# Patient Record
Sex: Male | Born: 1995 | Race: Black or African American | Hispanic: No | Marital: Single | Smoking: Never smoker
Health system: Southern US, Community
[De-identification: ages and names within clinical notes are randomized; demographics above are authoritative.]

---
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
## 2014-09-29 ENCOUNTER — Ambulatory Visit: Payer: Self-pay | Admitting: Family Medicine

## 2015-02-02 ENCOUNTER — Ambulatory Visit
Admission: EM | Admit: 2015-02-02 | Discharge: 2015-02-02 | Disposition: A | Discharge status: Home / Self Care | Payer: Worker's Compensation

## 2016-07-01 ENCOUNTER — Emergency Department
Admission: EM | Admit: 2016-07-01 | Discharge: 2016-07-01 | Disposition: A | Discharge status: Home / Self Care | Payer: 59 | Attending: Emergency Medicine | Admitting: Emergency Medicine

## 2016-07-01 DIAGNOSIS — R1010 Upper abdominal pain, unspecified: Secondary | ICD-10-CM | POA: Diagnosis present

## 2016-07-01 DIAGNOSIS — K2900 Acute gastritis without bleeding: Secondary | ICD-10-CM | POA: Diagnosis not present

## 2016-07-01 LAB — COMPREHENSIVE METABOLIC PANEL
ALBUMIN: 4.8 g/dL (ref 3.5–5.0)
ALK PHOS: 92 U/L (ref 38–126)
ALT: 23 U/L (ref 17–63)
ANION GAP: 7 (ref 5–15)
AST: 36 U/L (ref 15–41)
BILIRUBIN TOTAL: 0.6 mg/dL (ref 0.3–1.2)
BUN: 14 mg/dL (ref 6–20)
CALCIUM: 9.3 mg/dL (ref 8.9–10.3)
CO2: 30 mmol/L (ref 22–32)
Chloride: 102 mmol/L (ref 101–111)
Creatinine, Ser: 0.85 mg/dL (ref 0.61–1.24)
Glucose, Bld: 94 mg/dL (ref 65–99)
POTASSIUM: 3.8 mmol/L (ref 3.5–5.1)
Sodium: 139 mmol/L (ref 135–145)
TOTAL PROTEIN: 7.9 g/dL (ref 6.5–8.1)

## 2016-07-01 LAB — CBC
HEMATOCRIT: 47.5 % (ref 40.0–52.0)
HEMOGLOBIN: 15.6 g/dL (ref 13.0–18.0)
MCH: 27.9 pg (ref 26.0–34.0)
MCHC: 32.8 g/dL (ref 32.0–36.0)
MCV: 85 fL (ref 80.0–100.0)
Platelets: 239 10*3/uL (ref 150–440)
RBC: 5.59 MIL/uL (ref 4.40–5.90)
RDW: 13.7 % (ref 11.5–14.5)
WBC: 5.5 10*3/uL (ref 3.8–10.6)

## 2016-07-01 LAB — LIPASE, BLOOD: Lipase: 24 U/L (ref 11–51)

## 2016-07-01 MED ORDER — PANTOPRAZOLE SODIUM 40 MG PO TBEC: 40.0000 mg | DELAYED_RELEASE_TABLET | Freq: Every day | ORAL | 1 refills | Status: AC

## 2016-07-01 MED ORDER — GI COCKTAIL ~~LOC~~
ORAL | Status: AC
  Filled 2016-07-01: qty 30

## 2016-07-01 MED ORDER — SUCRALFATE 1 G PO TABS: 1.0000 g | ORAL_TABLET | Freq: Four times a day (QID) | ORAL | 0 refills | Status: AC

## 2016-07-01 MED ORDER — GI COCKTAIL ~~LOC~~
30.0000 mL | Freq: Once | ORAL | Status: AC
  Administered 2016-07-01: 30 mL via ORAL

## 2016-07-01 NOTE — ED Triage Notes (Signed)
Pt reports to ED w/ c/o upper abd pain x 1 month. Pt sts it's a burning pain, worse after eating.  Pt denies n/v/d. Pt A/OX4, resp even and unlabored, NAD.

## 2016-07-01 NOTE — ED Provider Notes (Signed)
[facility] Emergency Department Provider Note   ____________________________________________    I have reviewed the triage vital signs and the nursing notes.   HISTORY  Chief Complaint Abdominal Pain     HPI Logan Cannon is a 21 y.o. male who presents with complaints of abdominal pain. Patient complains of several months of upper abdominal pain after eating. Seems to resolve overnight but then start again when he eats breakfast. He complains of burning gnawing sensation in his upper abdomen. He has tried Prilosec and Tums without relief. No vomiting. No black stools.   History reviewed. No pertinent past medical history.  There are no active problems to display for this patient.   History reviewed. No pertinent surgical history.  Prior to Admission medications   Medication Sig Start Date End Date Taking? Authorizing Provider  pantoprazole (PROTONIX) 40 MG tablet Take 1 tablet (40 mg total) by mouth daily. 07/01/16 07/01/17  Jene Everyobert Regan Mcbryar, MD  sucralfate (CARAFATE) 1 g tablet Take 1 tablet (1 g total) by mouth 4 (four) times daily. 07/01/16 07/01/17  Jene Everyobert Zainab Crumrine, MD     Allergies Patient has no known allergies.  No family history on file.  Social History Social History  Substance Use Topics  . Smoking status: Never Smoker  . Smokeless tobacco: Never Used  . Alcohol use Not on file    Review of Systems  Constitutional: No fever/chills Eyes: No visual changes.   Cardiovascular: Denies chest pain. Respiratory: Denies shortness of breath. Gastrointestinal:As above   Musculoskeletal: Negative for back pain. Skin: Negative for rash. Neurological: Negative for headaches   10-point ROS otherwise negative.  ____________________________________________   PHYSICAL EXAM:  VITAL SIGNS: ED Triage Vitals  Enc Vitals Group     BP 07/01/16 1908 138/79     Pulse Rate 07/01/16 1908 76     Resp 07/01/16 1908 16     Temp 07/01/16 1908  98.8 F (37.1 C)     Temp Source 07/01/16 1908 Oral     SpO2 07/01/16 1908 100 %     Weight 07/01/16 1906 174 lb (78.9 kg)     Height 07/01/16 1906 6' (1.829 m)     Head Circumference --      Peak Flow --      Pain Score 07/01/16 1909 7     Pain Loc --      Pain Edu? --      Excl. in GC? --     Constitutional: Alert and oriented. No acute distress. Pleasant and interactive Eyes: Conjunctivae are normal.   Nose: No congestion/rhinnorhea. Mouth/Throat: Mucous membranes are moist.    Cardiovascular: Normal rate, regular rhythm. Good peripheral circulation. Respiratory: Normal respiratory effort.  No retractions.  Gastrointestinal: Mild epigastric tenderness palpation.. No distention.  No CVA tenderness. Genitourinary: deferred Musculoskeletal:  Warm and well perfused Neurologic:  Normal speech and language. No gross focal neurologic deficits are appreciated.  Skin:  Skin is warm, dry and intact. No rash noted. Psychiatric: Mood and affect are normal. Speech and behavior are normal.  ____________________________________________   LABS (all labs ordered are listed, but only abnormal results are displayed)  Labs Reviewed  LIPASE, BLOOD  COMPREHENSIVE METABOLIC PANEL  CBC   ____________________________________________  EKG  None ____________________________________________  RADIOLOGY  None ____________________________________________   PROCEDURES  Procedure(s) performed: No    Critical Care performed: No ____________________________________________   INITIAL IMPRESSION / ASSESSMENT AND PLAN / ED COURSE  Pertinent labs & imaging results that were  available during my care of the patient were reviewed by me and considered in my medical decision making (see chart for details).  Patient presents with epigastric abdominal pain which he has had for some time. It does appear to be related to eating. Strongly suspect gastritis versus PUD. We will give a GI cocktail  and reevaluate  Patient had resolution of pain with GI cocktail. I'll start him on a PPI and Carafate and have him follow-up with gastroenterology for likely endoscopy. patient agrees with the plan    ____________________________________________   FINAL CLINICAL IMPRESSION(S) / ED DIAGNOSES  Final diagnoses:  Acute gastritis without hemorrhage, unspecified gastritis type      NEW MEDICATIONS STARTED DURING THIS VISIT:  Discharge Medication List as of 07/01/2016  8:41 PM    START taking these medications   Details  pantoprazole (PROTONIX) 40 MG tablet Take 1 tablet (40 mg total) by mouth daily., Starting Fri 07/01/2016, Until Sat 07/01/2017, Print    sucralfate (CARAFATE) 1 g tablet Take 1 tablet (1 g total) by mouth 4 (four) times daily., Starting Fri 07/01/2016, Until Sat 07/01/2017, Print         Note:  This document was prepared using Dragon voice recognition software and may include unintentional dictation errors.    Jene Every, MD 07/01/16 2055

## 2016-07-28 ENCOUNTER — Ambulatory Visit (INDEPENDENT_AMBULATORY_CARE_PROVIDER_SITE_OTHER): Payer: 59 | Admitting: Gastroenterology

## 2016-07-28 ENCOUNTER — Encounter: Payer: Self-pay | Admitting: Gastroenterology

## 2016-07-28 VITALS — BP 152/82 | HR 79 | Temp 98.4°F | Resp 16 | Ht 72.0 in | Wt 167.2 lb

## 2016-07-28 DIAGNOSIS — R1013 Epigastric pain: Secondary | ICD-10-CM

## 2016-07-28 NOTE — Progress Notes (Signed)
   Gastroenterology Consultation  Referring Provider:     Dr Cyril Loosen  Primary Care Physician:  No PCP Per Patient Primary Gastroenterologist:  Dr. Wyline Mood  Reason for Consultation:     Abdominal pain         HPI:   Logan Cannon is a 21 y.o. y/o male referred by the ER.  He was recently seen at the ER for abdominal pain. He was given a GI cocktail and asked to follow up with GI. Ct scan back in 2009 suggested fecal impaction .Labs 06/2016- CBC, CMP-normal.   When he went into the ER had abdominal pain for a few weeks. Points to the upper abdomen , occurring every day, all day , at night as well . Localized. Eating made it worse. Bowel movements help. He has a bowel movement , twice a day soft . Denies any nsaid's use.  Was taking protonix and carafate which relieved all his pains and is pain free today . No smoking , alcohol or marijuana. No sick contacts, no heartburn, no weight loss. Feels well otherwise    No past medical history on file.  No past surgical history on file.  Prior to Admission medications   Medication Sig Start Date End Date Taking? Authorizing Provider  pantoprazole (PROTONIX) 40 MG tablet Take 1 tablet (40 mg total) by mouth daily. 07/01/16 07/01/17  Jene Every, MD  sucralfate (CARAFATE) 1 g tablet Take 1 tablet (1 g total) by mouth 4 (four) times daily. 07/01/16 07/01/17  Jene Every, MD    No family history on file.   Social History  Substance Use Topics  . Smoking status: Never Smoker  . Smokeless tobacco: Never Used  . Alcohol use Not on file    Allergies as of 07/28/2016  . (No Known Allergies)    Review of Systems:    All systems reviewed and negative except where noted in HPI.   Physical Exam:  BP (!) 152/82   Pulse 79   Temp 98.4 F (36.9 C) (Oral)   Resp 16   Ht 6' (1.829 m)   Wt 167 lb 3.2 oz (75.8 kg)   BMI 22.68 kg/m  No LMP for male patient. Psych:  Alert and cooperative. Normal mood and affect. General:   Alert,   Well-developed, well-nourished, pleasant and cooperative in NAD Head:  Normocephalic and atraumatic. Eyes:  Sclera clear, no icterus.   Conjunctiva pink. Ears:  Normal auditory acuity. Nose:  No deformity, discharge, or lesions. Mouth:  No deformity or lesions,oropharynx pink & moist. Neck:  Supple; no masses or thyromegaly. Lungs:  Respirations even and unlabored.  Clear throughout to auscultation.   No wheezes, crackles, or rhonchi. No acute distress. Heart:  Regular rate and rhythm; no murmurs, clicks, rubs, or gallops. Abdomen:  Normal bowel sounds.  No bruits.  Soft, non-tender and non-distended without masses, hepatosplenomegaly or hernias noted.  No guarding or rebound tenderness.    Extremities:  No clubbing or edema.  No cyanosis. Neurologic:  Alert and oriented x3;  grossly normal neurologically. Psych:  Alert and cooperative. Normal mood and affect.  Imaging Studies: No results found.  Assessment and Plan:   Logan Cannon is a 21 y.o. y/o male has been referred for abdominal pain which has resolved after commencing PPI. Likely a gastritis, advised to stop PPI. If pain recurs will then test for H pylori stool antigen.  Follow up as needed  Dr Wyline Mood MD

## 2016-07-29 ENCOUNTER — Telehealth: Payer: Self-pay | Admitting: Gastroenterology

## 2016-07-29 NOTE — Telephone Encounter (Signed)
Patient was seen in the office on 4/19 and was told if his stomach is still bothering him to call back to the office and we might do a stool sample. He isn't feeling well. Please call.

## 2016-08-01 ENCOUNTER — Other Ambulatory Visit
Admission: RE | Admit: 2016-08-01 | Discharge: 2016-08-01 | Disposition: A | Discharge status: Home / Self Care | Payer: 59 | Source: Ambulatory Visit | Attending: Gastroenterology | Admitting: Gastroenterology

## 2016-08-01 ENCOUNTER — Other Ambulatory Visit: Payer: Self-pay

## 2016-08-01 ENCOUNTER — Telehealth: Payer: Self-pay

## 2016-08-01 DIAGNOSIS — R1013 Epigastric pain: Secondary | ICD-10-CM

## 2016-08-01 NOTE — Telephone Encounter (Signed)
LVM for patient callback to advise H. Pylori lab order.

## 2016-08-01 NOTE — Telephone Encounter (Signed)
H pylori stool antigen needs to be checked

## 2016-08-03 LAB — H. PYLORI ANTIGEN, STOOL: H. PYLORI STOOL AG, EIA: NEGATIVE

## 2016-08-04 ENCOUNTER — Other Ambulatory Visit: Payer: Self-pay

## 2016-08-04 ENCOUNTER — Telehealth: Payer: Self-pay | Admitting: Gastroenterology

## 2016-08-04 NOTE — Telephone Encounter (Signed)
08/04/16 [employer] website for Colonoscopy 45378/ R10.9 has been approved Auth #: [id number].

## 2016-09-12 ENCOUNTER — Ambulatory Visit
Admission: RE | Admit: 2016-09-12 | Discharge: 2016-09-12 | Disposition: A | Discharge status: Home / Self Care | Payer: Worker's Compensation | Source: Ambulatory Visit | Attending: Family | Admitting: Family

## 2016-09-12 ENCOUNTER — Other Ambulatory Visit: Payer: Self-pay | Admitting: Family

## 2016-09-12 DIAGNOSIS — X58XXXA Exposure to other specified factors, initial encounter: Secondary | ICD-10-CM | POA: Diagnosis not present

## 2016-09-12 DIAGNOSIS — M25522 Pain in left elbow: Secondary | ICD-10-CM | POA: Diagnosis present

## 2016-09-12 DIAGNOSIS — R52 Pain, unspecified: Secondary | ICD-10-CM

## 2016-09-12 DIAGNOSIS — R609 Edema, unspecified: Secondary | ICD-10-CM | POA: Insufficient documentation

## 2016-09-12 DIAGNOSIS — S5002XA Contusion of left elbow, initial encounter: Secondary | ICD-10-CM | POA: Diagnosis not present

## 2018-11-12 ENCOUNTER — Other Ambulatory Visit: Payer: Self-pay

## 2018-11-12 ENCOUNTER — Encounter: Payer: Self-pay | Admitting: Emergency Medicine

## 2018-11-12 ENCOUNTER — Emergency Department
Admission: EM | Admit: 2018-11-12 | Discharge: 2018-11-12 | Discharge status: Against Medical Advice | Payer: Self-pay | Attending: Emergency Medicine | Admitting: Emergency Medicine

## 2018-11-12 ENCOUNTER — Emergency Department: Payer: Self-pay

## 2018-11-12 DIAGNOSIS — Z5321 Procedure and treatment not carried out due to patient leaving prior to being seen by health care provider: Secondary | ICD-10-CM | POA: Insufficient documentation

## 2018-11-12 DIAGNOSIS — R0602 Shortness of breath: Secondary | ICD-10-CM | POA: Insufficient documentation

## 2018-11-12 LAB — CBC
HCT: 46.6 % (ref 39.0–52.0)
Hemoglobin: 15.6 g/dL (ref 13.0–17.0)
MCH: 28.4 pg (ref 26.0–34.0)
MCHC: 33.5 g/dL (ref 30.0–36.0)
MCV: 84.9 fL (ref 80.0–100.0)
Platelets: 247 10*3/uL (ref 150–400)
RBC: 5.49 MIL/uL (ref 4.22–5.81)
RDW: 12.4 % (ref 11.5–15.5)
WBC: 7.4 10*3/uL (ref 4.0–10.5)
nRBC: 0 % (ref 0.0–0.2)

## 2018-11-12 LAB — BASIC METABOLIC PANEL
Anion gap: 11 (ref 5–15)
BUN: 10 mg/dL (ref 6–20)
CO2: 24 mmol/L (ref 22–32)
Calcium: 9.6 mg/dL (ref 8.9–10.3)
Chloride: 105 mmol/L (ref 98–111)
Creatinine, Ser: 1.12 mg/dL (ref 0.61–1.24)
GFR calc Af Amer: >60 mL/min (ref >60)
GFR calc non Af Amer: >60 mL/min (ref >60)
Glucose, Bld: 103 mg/dL — ABNORMAL HIGH (ref 70–99)
Potassium: 3.1 mmol/L — ABNORMAL LOW (ref 3.5–5.1)
Sodium: 140 mmol/L (ref 135–145)

## 2018-11-12 LAB — TROPONIN I (HIGH SENSITIVITY): Troponin I (High Sensitivity): 3 ng/L (ref <18)

## 2018-11-12 NOTE — ED Triage Notes (Signed)
Pt presents to ED via EMS with chest pain / tightness in the center of his chest with numbness to his hands. +SOB. Denies chest pain currently. Was pumping gas at onset. Denies similar symptoms previously. Hx of anxiety.

## 2020-03-13 IMAGING — CR CHEST - 2 VIEW
1 series · 2 of 2 positions shown · non-contrast
Comparison: None.

CLINICAL DATA: Chest pain

EXAM:
CHEST - 2 VIEW

[Series 1: dg chest 2 view · 0.14mm/px · 2 of 2 slices shown]
[im 1/2]
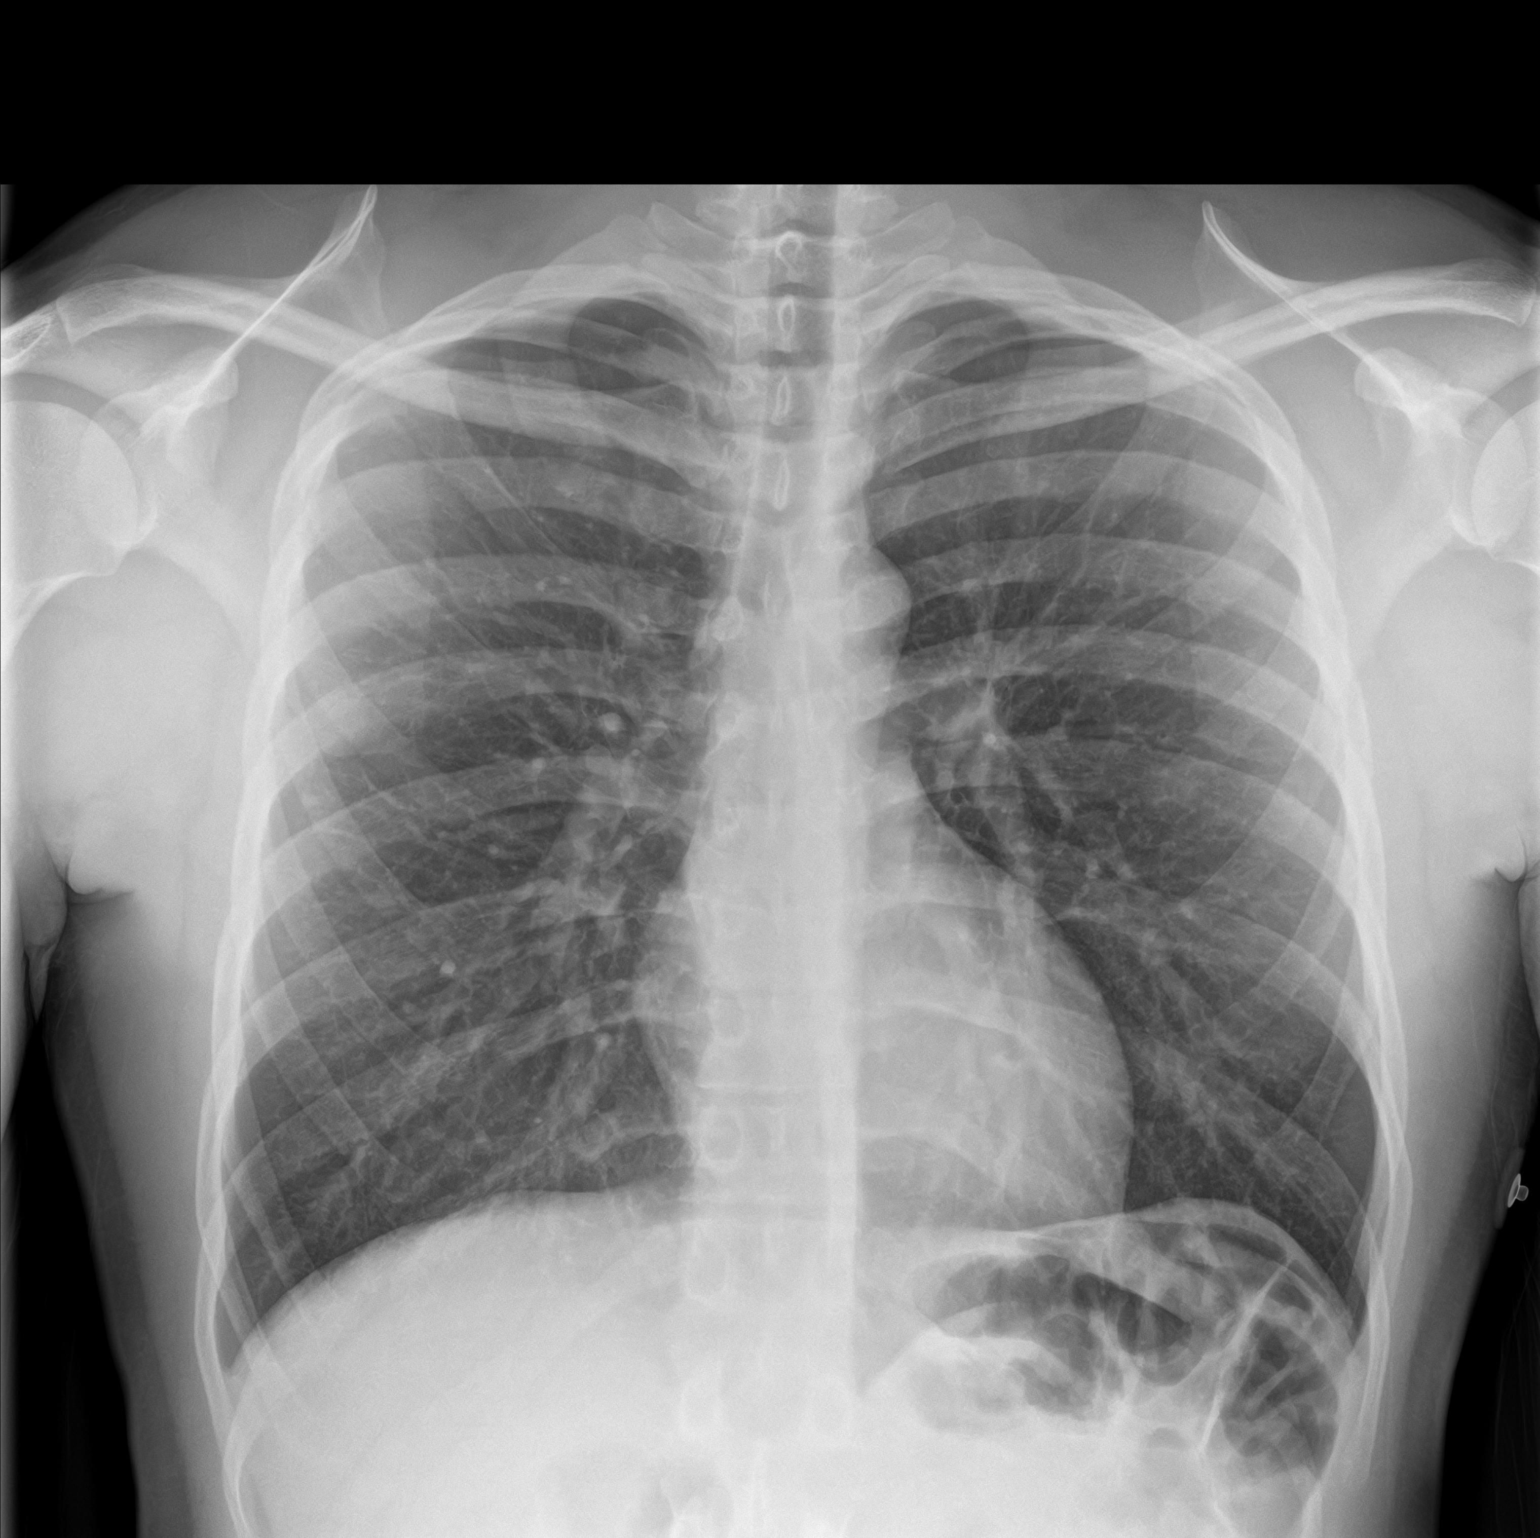
[im 2/2]
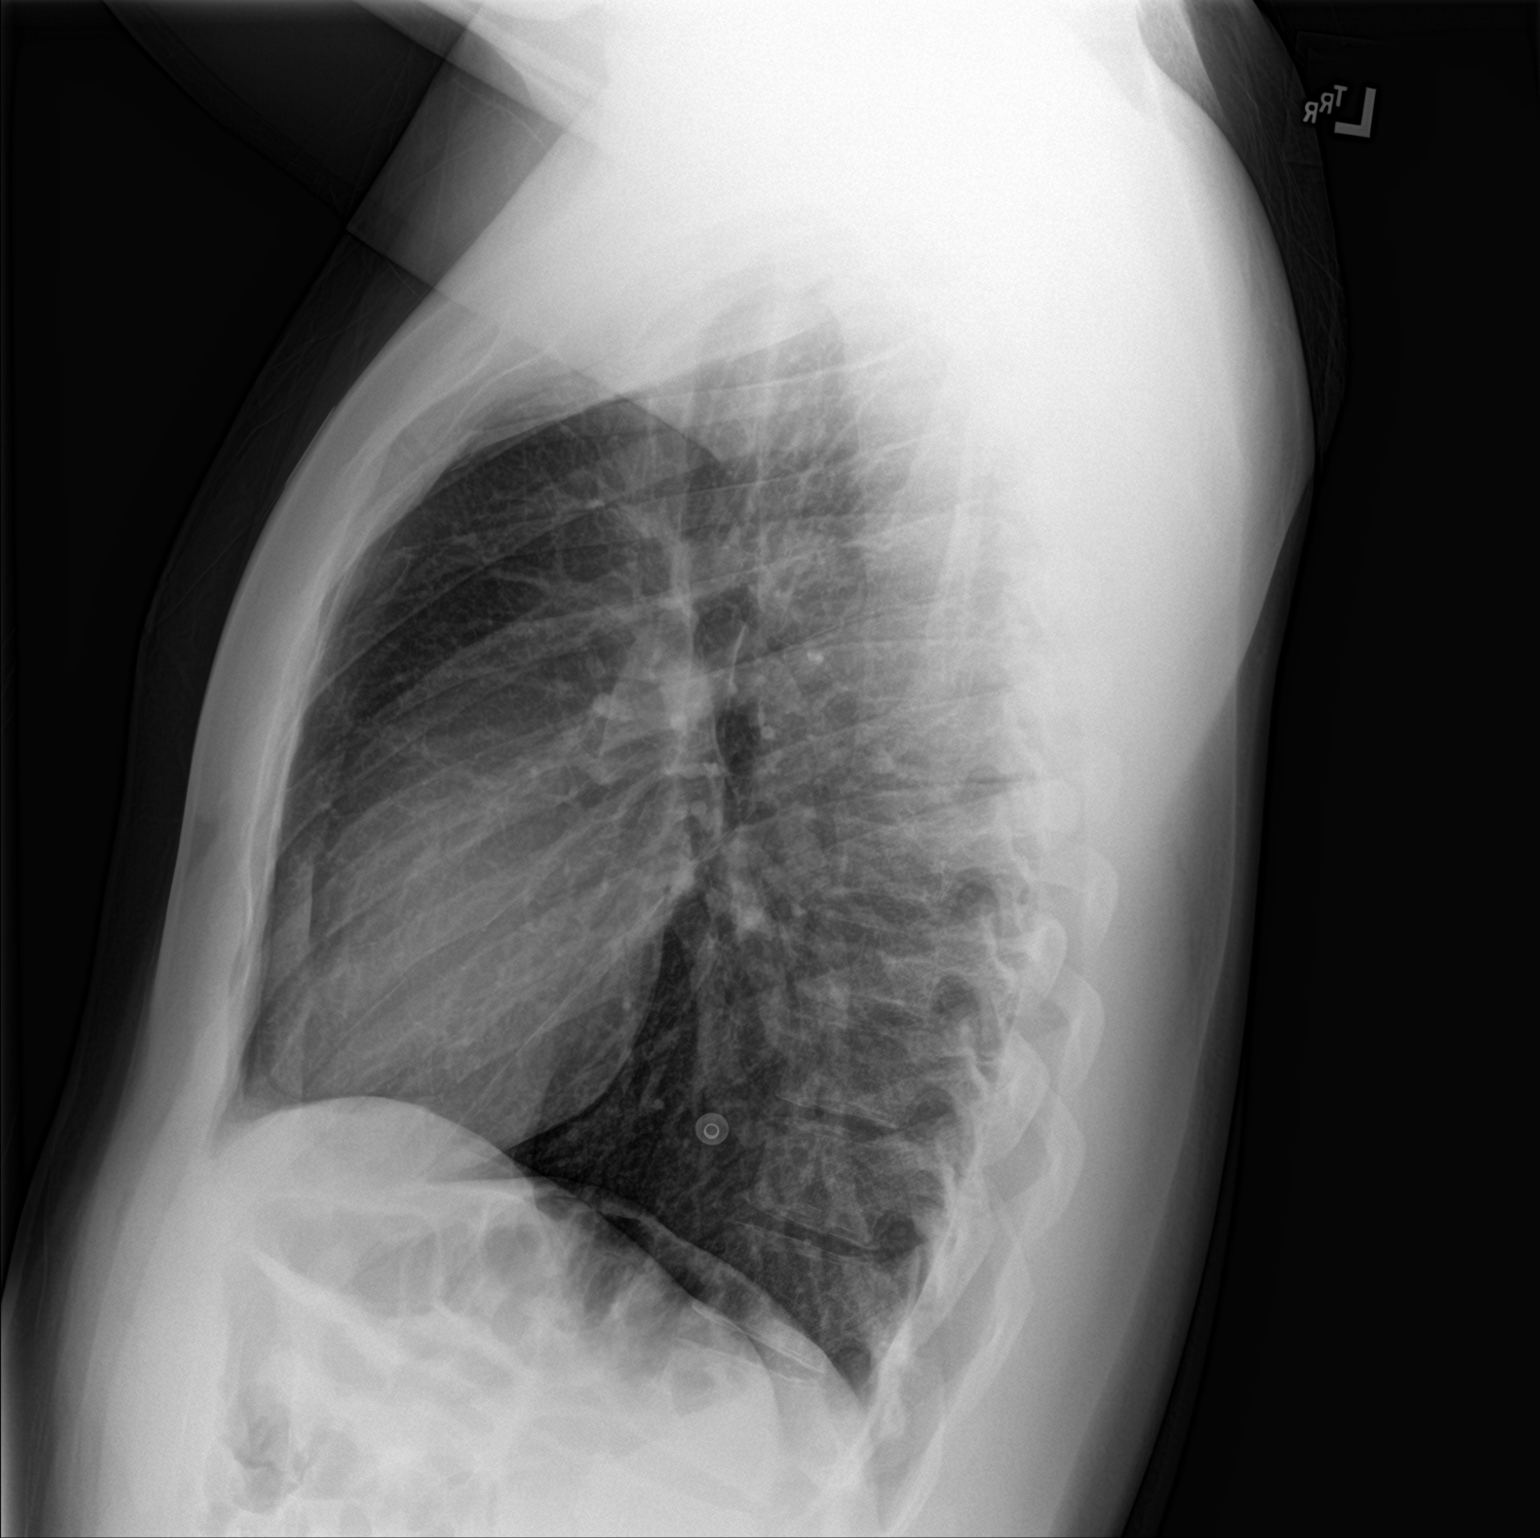

[2 of 2 positions shown; findings below may reference images not displayed]

FINDINGS: The heart size and mediastinal contours are within normal limits.
Both lungs are clear. The visualized skeletal structures are
unremarkable.
IMPRESSION: No active cardiopulmonary disease.
